# Patient Record
Sex: Female | Born: 2000 | Race: Black or African American | Hispanic: No | Marital: Single | Smoking: Never smoker
Health system: Southern US, Community
[De-identification: ages and names within clinical notes are randomized; demographics above are authoritative.]

## PROBLEM LIST (undated history)

## (undated) DIAGNOSIS — J45909 Unspecified asthma, uncomplicated: Secondary | ICD-10-CM

## (undated) DIAGNOSIS — Z789 Other specified health status: Secondary | ICD-10-CM

## (undated) DIAGNOSIS — R7303 Prediabetes: Secondary | ICD-10-CM

## (undated) HISTORY — PX: NO PAST SURGERIES: SHX2092

## (undated) HISTORY — DX: Other specified health status: Z78.9

---
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
## 2022-02-18 ENCOUNTER — Other Ambulatory Visit: Payer: Medicaid Other

## 2022-02-23 ENCOUNTER — Other Ambulatory Visit: Payer: Medicaid Other

## 2022-09-28 ENCOUNTER — Emergency Department (HOSPITAL_COMMUNITY)
Admission: EM | Admit: 2022-09-28 | Discharge: 2022-09-28 | Disposition: A | Discharge status: Home / Self Care | Payer: Self-pay | Attending: Emergency Medicine | Admitting: Emergency Medicine

## 2022-09-28 ENCOUNTER — Other Ambulatory Visit: Payer: Self-pay

## 2022-09-28 ENCOUNTER — Encounter (HOSPITAL_COMMUNITY): Payer: Self-pay | Admitting: Emergency Medicine

## 2022-09-28 DIAGNOSIS — J020 Streptococcal pharyngitis: Secondary | ICD-10-CM | POA: Insufficient documentation

## 2022-09-28 LAB — GROUP A STREP BY PCR: Group A Strep by PCR: DETECTED — AB

## 2022-09-28 MED ORDER — DEXAMETHASONE SODIUM PHOSPHATE 10 MG/ML IJ SOLN
10.0000 mg | Freq: Once | INTRAMUSCULAR | Status: AC
  Administered 2022-09-28: 10 mg via INTRAMUSCULAR
  Filled 2022-09-28: qty 1

## 2022-09-28 MED ORDER — AMOXICILLIN 500 MG PO CAPS: 500.0000 mg | ORAL_CAPSULE | Freq: Two times a day (BID) | ORAL | 0 refills | Status: AC

## 2022-09-28 MED ORDER — AMOXICILLIN 250 MG PO CAPS
500.0000 mg | ORAL_CAPSULE | Freq: Once | ORAL | Status: AC
  Administered 2022-09-28: 500 mg via ORAL
  Filled 2022-09-28: qty 2

## 2022-09-28 MED ORDER — IBUPROFEN 400 MG PO TABS: 400.0000 mg | ORAL_TABLET | Freq: Four times a day (QID) | ORAL | 0 refills | Status: DC | PRN

## 2022-09-28 NOTE — ED Triage Notes (Signed)
Pt c/o sore throat since Friday with white exudate. Pain rated 9/10 and voice sounds muffled. Denies recent sick contacts. Pt has felt warm but did not check temp at home.

## 2022-09-28 NOTE — ED Provider Notes (Signed)
[place] EMERGENCY DEPARTMENT AT [facility] Provider Note   CSN: [id number] Arrival date & time: 09/28/22  2113     History  Chief Complaint  Patient presents with   Sore Throat    Laura Moreno is a 22 y.o. female presenting with a 4-day history of sore throat, swollen tonsils and white patches on her tonsils along with subjective fever at home.  She has a history of tonsillitis in the past, to her knowledge she has never been diagnosed with strep throat however.  She difficulty sleeping last night secondary to snoring and pain.  She has had poor p.o. intake but has been able to tolerate fluids.  She has had no treatment for symptoms prior to arrival.  She denies nausea or vomiting, abdominal pain, cough or other URI symptoms.  The history is provided by the patient.       Home Medications Prior to Admission medications   Medication Sig Start Date End Date Taking? Authorizing Provider  amoxicillin (AMOXIL) 500 MG capsule Take 1 capsule (500 mg total) by mouth 2 (two) times daily for 10 days. 09/28/22 10/08/22 Yes Emonni Depasquale, Raynelle Fanning, PA-C  ibuprofen (ADVIL) 400 MG tablet Take 1 tablet (400 mg total) by mouth every 6 (six) hours as needed for moderate pain (and tonsillar swelling). 09/28/22  Yes Keelee Yankey, Raynelle Fanning, PA-C  fluconazole (DIFLUCAN) 150 MG tablet Take 1 now and 1 in 3 days 11/21/21   Adline Potter, NP      Allergies    Patient has no known allergies.    Review of Systems   Review of Systems  Constitutional:  Positive for chills and fever.  HENT:  Positive for sore throat and trouble swallowing. Negative for congestion, ear pain, rhinorrhea, sinus pressure and voice change.   Eyes:  Negative for discharge.  Respiratory:  Negative for cough, shortness of breath, wheezing and stridor.   Cardiovascular:  Negative for chest pain.  Gastrointestinal:  Negative for abdominal pain, nausea and vomiting.  Genitourinary: Negative.   All other systems reviewed and are  negative.   Physical Exam Updated Vital Signs BP 116/75 (BP Location: Right Arm)   Pulse (!) 109   Temp 99 F (37.2 C) (Oral)   Resp 15   Ht 5\' 6"  (1.676 m)   Wt 97.1 kg   LMP 09/16/2022 (Exact Date)   SpO2 100%   Breastfeeding No   BMI 34.54 kg/m  Physical Exam Vitals and nursing note reviewed.  Constitutional:      Appearance: She is well-developed.  HENT:     Head: Normocephalic and atraumatic.     Nose: Nose normal.     Mouth/Throat:     Palate: No mass and lesions.     Pharynx: Uvula midline.     Tonsils: Tonsillar exudate present. No tonsillar abscesses. 3+ on the right. 3+ on the left.     Comments: Bilateral tonsils symmetrically enlarged, erythematous with exudate.  No soft palate edema. Eyes:     Conjunctiva/sclera: Conjunctivae normal.  Cardiovascular:     Rate and Rhythm: Normal rate and regular rhythm.     Heart sounds: Normal heart sounds.  Pulmonary:     Effort: Pulmonary effort is normal.     Breath sounds: Normal breath sounds. No wheezing.  Abdominal:     General: Bowel sounds are normal.     Palpations: Abdomen is soft.     Tenderness: There is no abdominal tenderness.  Musculoskeletal:  General: Normal range of motion.     Cervical back: Normal range of motion.  Skin:    General: Skin is warm and dry.  Neurological:     Mental Status: She is alert.     ED Results / Procedures / Treatments   Labs (all labs ordered are listed, but only abnormal results are displayed) Labs Reviewed  GROUP A STREP BY PCR - Abnormal; Notable for the following components:      Result Value   Group A Strep by PCR DETECTED (*)    All other components within normal limits    EKG None  Radiology No results found.  Procedures Procedures    Medications Ordered in ED Medications  dexamethasone (DECADRON) injection 10 mg (has no administration in time range)  amoxicillin (AMOXIL) capsule 500 mg (500 mg Oral Given 09/28/22 2230)    ED Course/  Medical Decision Making/ A&P                             Medical Decision Making Patient presenting with day 4 of sore throat, swollen tonsils and exudate, subjective fever.  She is strep positive today.  There is no exam findings to suggest peritonsillar abscess.  Discussed treatment with penicillin, injection versus tablets, patient prefers tablets, first dose was given here.  She was also given an IM dose of Decadron to help with swelling.  Home instructions and strict return precautions were also discussed.  Amount and/or Complexity of Data Reviewed Labs: ordered.    Details: Strep positive  Risk Prescription drug management.           Final Clinical Impression(s) / ED Diagnoses Final diagnoses:  Strep throat    Rx / DC Orders ED Discharge Orders          Ordered    amoxicillin (AMOXIL) 500 MG capsule  2 times daily        09/28/22 2232    ibuprofen (ADVIL) 400 MG tablet  Every 6 hours PRN        09/28/22 2232              Burgess Amor, PA-C 09/28/22 2232    Terrilee Files, MD 09/29/22 [phone]

## 2022-09-28 NOTE — Discharge Instructions (Addendum)
Take your next dose of the antibiotic tomorrow morning.  I also recommend ibuprofen which has been prescribed for you.  This will help with pain and additional swelling although the steroid shot you received this evening should help greatly with the swelling over the next 24 hours.  Get rechecked immediately for any worsening swelling, worse pain, worse difficulty swallowing.

## 2023-03-29 ENCOUNTER — Encounter (HOSPITAL_COMMUNITY): Payer: Self-pay | Admitting: Radiology

## 2023-03-29 ENCOUNTER — Emergency Department (HOSPITAL_COMMUNITY): Payer: Self-pay

## 2023-03-29 ENCOUNTER — Other Ambulatory Visit: Payer: Self-pay

## 2023-03-29 ENCOUNTER — Emergency Department (HOSPITAL_COMMUNITY)
Admission: EM | Admit: 2023-03-29 | Discharge: 2023-03-29 | Disposition: A | Discharge status: Home / Self Care | Payer: Self-pay | Attending: Emergency Medicine | Admitting: Emergency Medicine

## 2023-03-29 DIAGNOSIS — O23599 Infection of other part of genital tract in pregnancy, unspecified trimester: Secondary | ICD-10-CM | POA: Insufficient documentation

## 2023-03-29 DIAGNOSIS — O3680X0 Pregnancy with inconclusive fetal viability, not applicable or unspecified: Secondary | ICD-10-CM

## 2023-03-29 DIAGNOSIS — Z3A00 Weeks of gestation of pregnancy not specified: Secondary | ICD-10-CM | POA: Insufficient documentation

## 2023-03-29 DIAGNOSIS — O26899 Other specified pregnancy related conditions, unspecified trimester: Secondary | ICD-10-CM | POA: Insufficient documentation

## 2023-03-29 DIAGNOSIS — A599 Trichomoniasis, unspecified: Secondary | ICD-10-CM

## 2023-03-29 DIAGNOSIS — O208 Other hemorrhage in early pregnancy: Secondary | ICD-10-CM | POA: Insufficient documentation

## 2023-03-29 LAB — WET PREP, GENITAL
Clue Cells Wet Prep HPF POC: NONE SEEN
Sperm: NONE SEEN
WBC, Wet Prep HPF POC: <10 (ref <10)
Yeast Wet Prep HPF POC: NONE SEEN

## 2023-03-29 LAB — URINALYSIS, ROUTINE W REFLEX MICROSCOPIC
Bilirubin Urine: NEGATIVE
Glucose, UA: NEGATIVE mg/dL
Hgb urine dipstick: NEGATIVE
Ketones, ur: NEGATIVE mg/dL
Nitrite: NEGATIVE
Protein, ur: 100 mg/dL — AB
Specific Gravity, Urine: 1.026 (ref 1.005–1.030)
Squamous Epithelial / HPF: >50 /[HPF] (ref 0–5)
pH: 7 (ref 5.0–8.0)

## 2023-03-29 LAB — CBC
HCT: 34.6 % — ABNORMAL LOW (ref 36.0–46.0)
Hemoglobin: 11.6 g/dL — ABNORMAL LOW (ref 12.0–15.0)
MCH: 29.6 pg (ref 26.0–34.0)
MCHC: 33.5 g/dL (ref 30.0–36.0)
MCV: 88.3 fL (ref 80.0–100.0)
Platelets: 299 10*3/uL (ref 150–400)
RBC: 3.92 MIL/uL (ref 3.87–5.11)
RDW: 13.1 % (ref 11.5–15.5)
WBC: 5.8 10*3/uL (ref 4.0–10.5)
nRBC: 0 % (ref 0.0–0.2)

## 2023-03-29 LAB — COMPREHENSIVE METABOLIC PANEL
ALT: 8 U/L (ref 0–44)
AST: 13 U/L — ABNORMAL LOW (ref 15–41)
Albumin: 3.5 g/dL (ref 3.5–5.0)
Alkaline Phosphatase: 54 U/L (ref 38–126)
Anion gap: 5 (ref 5–15)
BUN: 8 mg/dL (ref 6–20)
CO2: 26 mmol/L (ref 22–32)
Calcium: 8.2 mg/dL — ABNORMAL LOW (ref 8.9–10.3)
Chloride: 103 mmol/L (ref 98–111)
Creatinine, Ser: 0.66 mg/dL (ref 0.44–1.00)
GFR, Estimated: >60 mL/min (ref >60)
Glucose, Bld: 116 mg/dL — ABNORMAL HIGH (ref 70–99)
Potassium: 4.1 mmol/L (ref 3.5–5.1)
Sodium: 134 mmol/L — ABNORMAL LOW (ref 135–145)
Total Bilirubin: 0.9 mg/dL (ref <1.2)
Total Protein: 8 g/dL (ref 6.5–8.1)

## 2023-03-29 LAB — PREGNANCY, URINE: Preg Test, Ur: POSITIVE — AB

## 2023-03-29 LAB — SAMPLE TO BLOOD BANK

## 2023-03-29 LAB — HCG, QUANTITATIVE, PREGNANCY: hCG, Beta Chain, Quant, S: 283 m[IU]/mL — ABNORMAL HIGH (ref <5)

## 2023-03-29 MED ORDER — STERILE WATER FOR INJECTION IJ SOLN: 2.1000 mL | Freq: Once | INTRAMUSCULAR | Status: AC

## 2023-03-29 MED ORDER — CEPHALEXIN 500 MG PO CAPS: 500.0000 mg | ORAL_CAPSULE | Freq: Two times a day (BID) | ORAL | 0 refills | Status: AC

## 2023-03-29 MED ORDER — STERILE WATER FOR INJECTION IJ SOLN
INTRAMUSCULAR | Status: AC
  Administered 2023-03-29: 2.1 mL via INTRAMUSCULAR
  Filled 2023-03-29: qty 10

## 2023-03-29 MED ORDER — AZITHROMYCIN 250 MG PO TABS
1000.0000 mg | ORAL_TABLET | Freq: Once | ORAL | Status: AC
  Administered 2023-03-29: 1000 mg via ORAL
  Filled 2023-03-29: qty 4

## 2023-03-29 MED ORDER — METRONIDAZOLE 500 MG PO TABS: 500.0000 mg | ORAL_TABLET | Freq: Two times a day (BID) | ORAL | 0 refills | Status: DC

## 2023-03-29 MED ORDER — CEFTRIAXONE SODIUM 500 MG IJ SOLR
500.0000 mg | Freq: Once | INTRAMUSCULAR | Status: AC
  Administered 2023-03-29: 500 mg via INTRAMUSCULAR
  Filled 2023-03-29: qty 500

## 2023-03-29 MED ORDER — PRENATAL VITAMINS 28-0.8 MG PO TABS: 1.0000 | ORAL_TABLET | Freq: Every day | ORAL | 3 refills | Status: DC

## 2023-03-29 MED ORDER — METRONIDAZOLE 500 MG PO TABS
500.0000 mg | ORAL_TABLET | Freq: Once | ORAL | Status: AC
  Administered 2023-03-29: 500 mg via ORAL
  Filled 2023-03-29: qty 1

## 2023-03-29 NOTE — ED Notes (Signed)
Pt in [place]

## 2023-03-29 NOTE — ED Provider Notes (Signed)
[place] EMERGENCY DEPARTMENT AT [facility] Provider Note   CSN: [id number] Arrival date & time: 03/29/23  1751     History  Chief Complaint  Patient presents with   Abdominal Pain    Laura Moreno is a 22 y.o. female.  He has PMH of metabolic syndrome and prediabetes, presents the ER complaining of lower abdominal pain for the past 4 to 5 days with vaginal spotting the day it started.  Had positive home pregnancy test.  She is G2, P1 with no complications in her first pregnancy several years ago.  Denies chest pain or shortness of breath, no vomiting, complains of mild headaches.  Pain is in the suprapubic and right pelvic area primarily.  No urinary complaints, no abnormal vaginal discharge.   Abdominal Pain      Home Medications Prior to Admission medications   Medication Sig Start Date End Date Taking? Authorizing Provider  cephALEXin (KEFLEX) 500 MG capsule Take 1 capsule (500 mg total) by mouth 2 (two) times daily for 5 days. 03/29/23 04/03/23 Yes Deontez Klinke A, PA-C  metroNIDAZOLE (FLAGYL) 500 MG tablet Take 1 tablet (500 mg total) by mouth 2 (two) times daily. 03/29/23  Yes Gal Smolinski A, PA-C  fluconazole (DIFLUCAN) 150 MG tablet Take 1 now and 1 in 3 days 11/21/21   Cyril Mourning A, NP  ibuprofen (ADVIL) 400 MG tablet Take 1 tablet (400 mg total) by mouth every 6 (six) hours as needed for moderate pain (and tonsillar swelling). 09/28/22   Burgess Amor, PA-C      Allergies    Patient has no known allergies.    Review of Systems   Review of Systems  Gastrointestinal:  Positive for abdominal pain.    Physical Exam Updated Vital Signs BP 120/70 (BP Location: Right Arm)   Pulse 94   Temp 98.8 F (37.1 C) (Oral)   Resp 18   Ht 5\' 6"  (1.676 m)   Wt 129.7 kg   SpO2 97%   BMI 46.16 kg/m  Physical Exam Vitals and nursing note reviewed.  Constitutional:      General: She is not in acute distress.    Appearance: She is well-developed.   HENT:     Head: Normocephalic and atraumatic.  Eyes:     Conjunctiva/sclera: Conjunctivae normal.  Cardiovascular:     Rate and Rhythm: Normal rate and regular rhythm.     Heart sounds: No murmur heard. Pulmonary:     Effort: Pulmonary effort is normal. No respiratory distress.     Breath sounds: Normal breath sounds.  Abdominal:     Palpations: Abdomen is soft.     Tenderness: There is abdominal tenderness in the suprapubic area.  Musculoskeletal:        General: No swelling.     Cervical back: Neck supple.  Skin:    General: Skin is warm and dry.     Capillary Refill: Capillary refill takes less than 2 seconds.  Neurological:     Mental Status: She is alert.  Psychiatric:        Mood and Affect: Mood normal.     ED Results / Procedures / Treatments   Labs (all labs ordered are listed, but only abnormal results are displayed) Labs Reviewed  WET PREP, GENITAL - Abnormal; Notable for the following components:      Result Value   Trich, Wet Prep PRESENT (*)    All other components within normal limits  COMPREHENSIVE METABOLIC PANEL - Abnormal;  Notable for the following components:   Sodium 134 (*)    Glucose, Bld 116 (*)    Calcium 8.2 (*)    AST 13 (*)    All other components within normal limits  CBC - Abnormal; Notable for the following components:   Hemoglobin 11.6 (*)    HCT 34.6 (*)    All other components within normal limits  URINALYSIS, ROUTINE W REFLEX MICROSCOPIC - Abnormal; Notable for the following components:   APPearance CLOUDY (*)    Protein, ur 100 (*)    Leukocytes,Ua LARGE (*)    Bacteria, UA FEW (*)    All other components within normal limits  PREGNANCY, URINE - Abnormal; Notable for the following components:   Preg Test, Ur POSITIVE (*)    All other components within normal limits  HCG, QUANTITATIVE, PREGNANCY - Abnormal; Notable for the following components:   hCG, Beta Chain, Quant, S 283 (*)    All other components within normal limits   URINE CULTURE  SAMPLE TO BLOOD BANK  GC/CHLAMYDIA PROBE AMP ([place]) NOT AT [facility]    EKG None  Radiology US OB LESS THAN 14 WEEKS WITH OB TRANSVAGINAL  Result Date: 03/29/2023 CLINICAL DATA:  Initial evaluation for early pregnancy, vaginal bleeding. EXAM: OBSTETRIC <14 WK [place] AND TRANSVAGINAL OB US TECHNIQUE: Both transabdominal and transvaginal ultrasound examinations were performed for complete evaluation of the gestation as well as the maternal uterus, adnexal regions, and pelvic cul-de-sac. Transvaginal technique was performed to assess early pregnancy. COMPARISON:  None Available. FINDINGS: Intrauterine gestational sac: Negative Yolk sac:  Negative Embryo:  Negative Cardiac Activity: Negative Heart Rate: N/A Subchorionic hemorrhage:  None visualized. Maternal uterus/adnexae: Neither ovary is visualized. No adnexal mass or free fluid. IMPRESSION: 1. Early pregnancy with no discrete IUP or adnexal mass identified. Finding is consistent with a pregnancy of unknown anatomic location. Differential considerations include IUP to early to visualize, recent SAB, or possibly occult ectopic pregnancy. Close clinical monitoring with serial beta HCGs and close interval follow-up ultrasound recommended as clinically warranted. 2. No other acute maternal uterine or adnexal abnormality. Electronically Signed   By: Rise Mu M.D.   On: 03/29/2023 19:22    Procedures Procedures    Medications Ordered in ED Medications  metroNIDAZOLE (FLAGYL) tablet 500 mg (500 mg Oral Given 03/29/23 1949)    ED Course/ Medical Decision Making/ A&P Clinical Course as of 03/29/23 2010  Mon Mar 29, 2023  [id number] 22 year old female who is G2, P1 approximately [redacted] weeks pregnant based on LMP.  Presents for gradually worsening lower abdominal pain for the past 4 to 5 days, had some light vaginal spotting when this started.  Has had a positive home pregnancy test.  Now noted to be having pelvic pain just to the  right of midline.  She is tachycardic but abdomen is soft with only mild to moderate tenderness in the left suprapubic area and right lower pelvic area.  No current vaginal bleeding.  She has noted to be A+ blood type.  Labs ordered and ultrasound ordered for rule out ectopic. [CB]    Clinical Course User Index [CB] Ma Rings, PA-C                                 Medical Decision Making Ddx: ectopic pregnancy, early iup, ovarian torsion, ovarian cyst, appendicitis, other ED course: Briefly as above patient's about [redacted] weeks pregnant had some  lower abdominal pain for the past 4 to 5 days with small amount of vaginal spotting.  When she arrived she was mildly tachycardic but this resolved with no intervention.  I did do a FAST exam on initial evaluation which was negative, I do not see an IUP in the uterus.  Ultrasound ordered to rule out ectopic pregnancy.  No IUP on ultrasound, beta-hCG is 283, could be early IUP versus ectopic pregnancy that was not visualized.  She sitting in bed, comfortable, vital signs are normal.  She has not required any pain medication in the ED.  I consulted with Dr. Despina Hidden who recommends Follow up Wednesday in clinic for repeat HCG  Patient also has positive trichomoniasis will treat with Flagyl, GC chlamydia in process, she has mild anemia hemoglobin 11.6, no recent for comparison though she has been anemic in the past.  Urine shows large leuks with 11-20 red blood cells 11-20 white blood cells few bacteria and greater than 50 squamous epithelial cells, sent for culture but will treat patient with Keflex due to her being pregnant.  Amount and/or Complexity of Data Reviewed External Data Reviewed: labs and notes.    Details: Blood typing shows blood type a positive Labs: ordered. Decision-making details documented in ED Course. Radiology: ordered. Decision-making details documented in ED Course.  Risk OTC drugs. Prescription drug  management.           Final Clinical Impression(s) / ED Diagnoses Final diagnoses:  Pregnancy of unknown anatomic location  Trichomoniasis    Rx / DC Orders ED Discharge Orders          Ordered    metroNIDAZOLE (FLAGYL) 500 MG tablet  2 times daily        03/29/23 1959    cephALEXin (KEFLEX) 500 MG capsule  2 times daily        03/29/23 1959              Josem Kaufmann 03/29/23 2117    Lonell Grandchild, MD 03/30/23 1534

## 2023-03-29 NOTE — ED Notes (Signed)
Pt self swabbed for wet prep and gc/chlamydia

## 2023-03-29 NOTE — Discharge Instructions (Addendum)
You were seen in the emergency room today for pregnancy with lower abdominal pain.  You are pregnant but your ultrasound did not show location of the pregnancy, possibly because it is too early along.  The other possibility is that you have an ectopic pregnancy.  It is very important for you to follow-up Wednesday for repeat blood work to check your hCG level to make sure it is rising appropriately.  We also tested for sexually transmitted infections.  You are positive for trichomoniasis.  We are treating you with an antibiotic called Flagyl for this.  This is contagious.  You to tell any partners that they need to be tested and treated.  Your testing for gonorrhea and chlamydia are pending but we have treated you for both at your request.  Do not have sex for 7 days.  Important to use condoms to prevent infection.  Make sure your partner has also been treated completely before resuming having sex with him.  Your urine also had some bacteria in this.  We have started you on antibiotic for this.  Your urine was sent for culture.  Your OB/GYN doctor can follow-up on the culture results to see if you need to continue the antibiotics.

## 2023-03-29 NOTE — ED Triage Notes (Signed)
Pt states she has had severe abd pain since last week and had a positive pregnancy test last week. Pt states her last period began on October 19. Pt states she had one day of very light bleeding last Thursday.

## 2023-03-30 LAB — GC/CHLAMYDIA PROBE AMP (~~LOC~~) NOT AT ARMC
Chlamydia: NEGATIVE
Comment: NEGATIVE
Comment: NORMAL
Neisseria Gonorrhea: NEGATIVE

## 2023-03-30 LAB — URINE CULTURE

## 2023-03-31 ENCOUNTER — Other Ambulatory Visit: Payer: Self-pay

## 2023-04-07 ENCOUNTER — Other Ambulatory Visit: Payer: Self-pay

## 2023-04-10 ENCOUNTER — Emergency Department (HOSPITAL_COMMUNITY): Payer: Self-pay

## 2023-04-10 ENCOUNTER — Encounter (HOSPITAL_COMMUNITY): Payer: Self-pay | Admitting: *Deleted

## 2023-04-10 ENCOUNTER — Other Ambulatory Visit: Payer: Self-pay

## 2023-04-10 ENCOUNTER — Emergency Department (HOSPITAL_COMMUNITY)
Admission: EM | Admit: 2023-04-10 | Discharge: 2023-04-10 | Disposition: A | Discharge status: Home / Self Care | Payer: Self-pay | Attending: Emergency Medicine | Admitting: Emergency Medicine

## 2023-04-10 DIAGNOSIS — O200 Threatened abortion: Secondary | ICD-10-CM | POA: Insufficient documentation

## 2023-04-10 DIAGNOSIS — N939 Abnormal uterine and vaginal bleeding, unspecified: Secondary | ICD-10-CM | POA: Insufficient documentation

## 2023-04-10 DIAGNOSIS — R1030 Lower abdominal pain, unspecified: Secondary | ICD-10-CM | POA: Insufficient documentation

## 2023-04-10 LAB — BASIC METABOLIC PANEL
Anion gap: 6 (ref 5–15)
BUN: 11 mg/dL (ref 6–20)
CO2: 26 mmol/L (ref 22–32)
Calcium: 8.3 mg/dL — ABNORMAL LOW (ref 8.9–10.3)
Chloride: 102 mmol/L (ref 98–111)
Creatinine, Ser: 0.7 mg/dL (ref 0.44–1.00)
GFR, Estimated: >60 mL/min (ref >60)
Glucose, Bld: 123 mg/dL — ABNORMAL HIGH (ref 70–99)
Potassium: 3.7 mmol/L (ref 3.5–5.1)
Sodium: 134 mmol/L — ABNORMAL LOW (ref 135–145)

## 2023-04-10 LAB — CBC WITH DIFFERENTIAL/PLATELET
Abs Immature Granulocytes: 0.02 10*3/uL (ref 0.00–0.07)
Basophils Absolute: 0 10*3/uL (ref 0.0–0.1)
Basophils Relative: 1 %
Eosinophils Absolute: 0.1 10*3/uL (ref 0.0–0.5)
Eosinophils Relative: 1 %
HCT: 35.7 % — ABNORMAL LOW (ref 36.0–46.0)
Hemoglobin: 11.9 g/dL — ABNORMAL LOW (ref 12.0–15.0)
Immature Granulocytes: 0 %
Lymphocytes Relative: 33 %
Lymphs Abs: 1.8 10*3/uL (ref 0.7–4.0)
MCH: 28.9 pg (ref 26.0–34.0)
MCHC: 33.3 g/dL (ref 30.0–36.0)
MCV: 86.7 fL (ref 80.0–100.0)
Monocytes Absolute: 0.6 10*3/uL (ref 0.1–1.0)
Monocytes Relative: 11 %
Neutro Abs: 3.1 10*3/uL (ref 1.7–7.7)
Neutrophils Relative %: 54 %
Platelets: 313 10*3/uL (ref 150–400)
RBC: 4.12 MIL/uL (ref 3.87–5.11)
RDW: 12.7 % (ref 11.5–15.5)
WBC: 5.6 10*3/uL (ref 4.0–10.5)
nRBC: 0 % (ref 0.0–0.2)

## 2023-04-10 LAB — HCG, QUANTITATIVE, PREGNANCY: hCG, Beta Chain, Quant, S: 105 m[IU]/mL — ABNORMAL HIGH (ref <5)

## 2023-04-10 LAB — ABO/RH: ABO/RH(D): A POS

## 2023-04-10 MED ORDER — SODIUM CHLORIDE 0.9 % IV BOLUS
1000.0000 mL | Freq: Once | INTRAVENOUS | Status: AC
  Administered 2023-04-10: 1000 mL via INTRAVENOUS

## 2023-04-10 NOTE — ED Provider Notes (Signed)
[place] EMERGENCY DEPARTMENT AT [facility] Provider Note   CSN: [id number] Arrival date & time: 04/10/23  1431     History  Chief Complaint  Patient presents with   Vaginal Bleeding    Laura Moreno is a 22 y.o. female.  Patient to ED with vaginal bleeding that started earlier today. Positive pregnancy test on ED visit with a quant HCG of 283. She has not started prenatal care yet.   The history is provided by the patient. No language interpreter was used.  Vaginal Bleeding      Home Medications Prior to Admission medications   Medication Sig Start Date End Date Taking? Authorizing Provider  fluconazole (DIFLUCAN) 150 MG tablet Take 1 now and 1 in 3 days 11/21/21   Cyril Mourning A, NP  ibuprofen (ADVIL) 400 MG tablet Take 1 tablet (400 mg total) by mouth every 6 (six) hours as needed for moderate pain (and tonsillar swelling). 09/28/22   Burgess Amor, PA-C  metroNIDAZOLE (FLAGYL) 500 MG tablet Take 1 tablet (500 mg total) by mouth 2 (two) times daily. 03/29/23   Carmel Sacramento A, PA-C  Prenatal Vit-Fe Fumarate-FA (PRENATAL VITAMINS) 28-0.8 MG TABS Take 1 tablet by mouth daily at 12 noon. 03/29/23   Carmel Sacramento A, PA-C      Allergies    Patient has no known allergies.    Review of Systems   Review of Systems  Genitourinary:  Positive for vaginal bleeding.    Physical Exam Updated Vital Signs BP 131/77   Pulse (!) 110   Temp 98.5 F (36.9 C) (Oral)   Resp 18   Ht 5\' 6"  (1.676 m)   Wt 122.5 kg   LMP 02/20/2023   SpO2 100%   BMI 43.58 kg/m  Physical Exam Vitals and nursing note reviewed.  Constitutional:      Appearance: Normal appearance.  Pulmonary:     Effort: Pulmonary effort is normal.  Abdominal:     Palpations: Abdomen is soft.     Tenderness: There is abdominal tenderness in the suprapubic area.  Musculoskeletal:        General: Normal range of motion.  Skin:    General: Skin is warm and dry.     Coloration: Skin is not pale.   Neurological:     Mental Status: She is alert and oriented to person, place, and time.     ED Results / Procedures / Treatments   Labs (all labs ordered are listed, but only abnormal results are displayed) Labs Reviewed  HCG, QUANTITATIVE, PREGNANCY - Abnormal; Notable for the following components:      Result Value   hCG, Beta Chain, Quant, S 105 (*)    All other components within normal limits  CBC WITH DIFFERENTIAL/PLATELET - Abnormal; Notable for the following components:   Hemoglobin 11.9 (*)    HCT 35.7 (*)    All other components within normal limits  BASIC METABOLIC PANEL - Abnormal; Notable for the following components:   Sodium 134 (*)    Glucose, Bld 123 (*)    Calcium 8.3 (*)    All other components within normal limits  ABO/RH   Results for orders placed or performed during the hospital encounter of 04/10/23  hCG, quantitative, pregnancy  Result Value Ref Range   hCG, Beta Chain, Quant, S 105 (H) <5 mIU/mL  CBC with Differential  Result Value Ref Range   WBC 5.6 4.0 - 10.5 K/uL   RBC 4.12 3.87 - 5.11  MIL/uL   Hemoglobin 11.9 (L) 12.0 - 15.0 g/dL   HCT 28.4 (L) 13.2 - 44.0 %   MCV 86.7 80.0 - 100.0 fL   MCH 28.9 26.0 - 34.0 pg   MCHC 33.3 30.0 - 36.0 g/dL   RDW 10.2 72.5 - 36.6 %   Platelets 313 150 - 400 K/uL   nRBC 0.0 0.0 - 0.2 %   Neutrophils Relative % 54 %   Neutro Abs 3.1 1.7 - 7.7 K/uL   Lymphocytes Relative 33 %   Lymphs Abs 1.8 0.7 - 4.0 K/uL   Monocytes Relative 11 %   Monocytes Absolute 0.6 0.1 - 1.0 K/uL   Eosinophils Relative 1 %   Eosinophils Absolute 0.1 0.0 - 0.5 K/uL   Basophils Relative 1 %   Basophils Absolute 0.0 0.0 - 0.1 K/uL   Immature Granulocytes 0 %   Abs Immature Granulocytes 0.02 0.00 - 0.07 K/uL  Basic metabolic panel  Result Value Ref Range   Sodium 134 (L) 135 - 145 mmol/L   Potassium 3.7 3.5 - 5.1 mmol/L   Chloride 102 98 - 111 mmol/L   CO2 26 22 - 32 mmol/L   Glucose, Bld 123 (H) 70 - 99 mg/dL   BUN 11 6 - 20  mg/dL   Creatinine, Ser 4.40 0.44 - 1.00 mg/dL   Calcium 8.3 (L) 8.9 - 10.3 mg/dL   GFR, Estimated >34 >74 mL/min   Anion gap 6 5 - 15  ABO/Rh  Result Value Ref Range   ABO/RH(D)      A POS Performed at [facility], [address], [place], [place] [address]      EKG None  Radiology US OB LESS THAN 14 WEEKS WITH OB TRANSVAGINAL  Result Date: 04/10/2023 CLINICAL DATA:  Vaginal bleeding affecting in early pregnancy. EXAM: OBSTETRIC <14 WK [place] AND TRANSVAGINAL OB US TECHNIQUE: Both transabdominal and transvaginal ultrasound examinations were performed for complete evaluation of the gestation as well as the maternal uterus, adnexal regions, and pelvic cul-de-sac. Transvaginal technique was performed to assess early pregnancy. COMPARISON:  03/29/2023 FINDINGS: Intrauterine gestational sac: None Yolk sac:  Not Visualized. Embryo:  Not Visualized. Cardiac Activity: Not Visualized. Heart Rate: NA  bpm Maternal uterus/adnexae: Right ovary: Not visualized Left ovary: Appears normal Other :Fluid noted within the cervical canal. Free fluid:  Trace free fluid within the pelvis. IMPRESSION: 1. No intrauterine gestational sac, yolk sac, or fetal pole identified. Differential considerations include intrauterine pregnancy too early to be sonographically visualized, missed abortion, or ectopic pregnancy. Followup ultrasound is recommended in 10-14 days for further evaluation. Electronically Signed   By: Signa Kell M.D.   On: 04/10/2023 17:56    Procedures Procedures    Medications Ordered in ED Medications  sodium chloride 0.9 % bolus 1,000 mL (1,000 mLs Intravenous New Bag/Given 04/10/23 1624)    ED Course/ Medical Decision Making/ A&P                                 Medical Decision Making This patient presents to the ED for concern of pregnant vaginal bleeding, this involves an extensive number of treatment options, and is a complaint that carries with it a high risk of complications and  morbidity.  The differential diagnosis includes miscarriage, ectopic,    Co morbidities that complicate the patient evaluation  Metabolic syndrome, prediabetes   Additional history obtained:  Additional history and/or information obtained from  chart review, notable for Last ED visit for lower abdominal pain and spotting, found to be pregnant at that time.    Lab Tests:  I Ordered, and personally interpreted labs.  The pertinent results include:  Quant decreased from 283 previously to 105 today.  Hemoglobin stable.    Imaging Studies ordered:  I ordered imaging studies including OB ultrasound I independently visualized and interpreted imaging which showed No gestational sac visualized I agree with the radiologist interpretation - ddx includes early IUP, ectopic, miscarriage.   Test Considered:  Repeat US in 10-14 days as recommended by radiology   Problem List / ED Course:  Patient known to be pregnant now with heavy vaginal bleeding.  G2P1, uncomplicated first pregnancy.  [place] ordered - no IUP visulized.    Social Determinants of Health:  Has reliable transportation.   Disposition:  After consideration of the diagnostic results and the patients response to treatment, I feel that the patient would benefit from discharge home with follow up at [facility] for repeat US in 10 days, and repeat quant.    Amount and/or Complexity of Data Reviewed Labs: ordered. Radiology: ordered.           Final Clinical Impression(s) / ED Diagnoses Final diagnoses:  Threatened miscarriage    Rx / DC Orders ED Discharge Orders     None         Elpidio Anis, PA-C 04/10/23 1825    Vanetta Mulders, MD 04/11/23 [phone]

## 2023-04-10 NOTE — Discharge Instructions (Signed)
You will need to follow up with OB/GYN for repeat ultrasound as we discussed. Call Dr. Despina Hidden to schedule a time for that study.   If your bleeding worsens, or you develop severe pain or a fever, return to the ED for further evaluation.

## 2023-04-10 NOTE — ED Triage Notes (Addendum)
Pt with heavy vaginal bleeding an hour ago, gone through two pads with small clots.  Pt states she is about [redacted] weeks pregnant, has not seen OB yet.  + home pregnancy test. LMP 02/20/23

## 2023-12-10 ENCOUNTER — Ambulatory Visit: Payer: Self-pay

## 2023-12-13 ENCOUNTER — Ambulatory Visit: Payer: Self-pay

## 2023-12-14 ENCOUNTER — Emergency Department (HOSPITAL_COMMUNITY)
Admission: EM | Admit: 2023-12-14 | Discharge: 2023-12-14 | Disposition: A | Discharge status: Home / Self Care | Payer: Self-pay | Attending: Emergency Medicine | Admitting: Emergency Medicine

## 2023-12-14 ENCOUNTER — Encounter (HOSPITAL_COMMUNITY): Payer: Self-pay

## 2023-12-14 DIAGNOSIS — A5901 Trichomonal vulvovaginitis: Secondary | ICD-10-CM

## 2023-12-14 DIAGNOSIS — Z202 Contact with and (suspected) exposure to infections with a predominantly sexual mode of transmission: Secondary | ICD-10-CM | POA: Insufficient documentation

## 2023-12-14 LAB — PREGNANCY, URINE: Preg Test, Ur: NEGATIVE

## 2023-12-14 LAB — URINALYSIS, ROUTINE W REFLEX MICROSCOPIC
Bilirubin Urine: NEGATIVE
Glucose, UA: NEGATIVE mg/dL
Hgb urine dipstick: NEGATIVE
Ketones, ur: NEGATIVE mg/dL
Nitrite: NEGATIVE
Protein, ur: 30 mg/dL — AB
Specific Gravity, Urine: 1.025 (ref 1.005–1.030)
Squamous Epithelial / HPF: >50 /HPF (ref 0–5)
pH: 5 (ref 5.0–8.0)

## 2023-12-14 LAB — HIV ANTIBODY (ROUTINE TESTING W REFLEX): HIV Screen 4th Generation wRfx: NONREACTIVE

## 2023-12-14 MED ORDER — LIDOCAINE HCL (PF) 1 % IJ SOLN
2.1000 mL | Freq: Once | INTRAMUSCULAR | Status: AC
  Administered 2023-12-14 (×2): 2.1 mL
  Filled 2023-12-14: qty 5

## 2023-12-14 MED ORDER — METRONIDAZOLE 500 MG PO TABS: 500.0000 mg | ORAL_TABLET | Freq: Two times a day (BID) | ORAL | 0 refills | Status: AC

## 2023-12-14 MED ORDER — CEFTRIAXONE SODIUM 500 MG IJ SOLR
500.0000 mg | Freq: Once | INTRAMUSCULAR | Status: AC
  Administered 2023-12-14 (×2): 500 mg via INTRAMUSCULAR
  Filled 2023-12-14: qty 500

## 2023-12-14 MED ORDER — DOXYCYCLINE HYCLATE 100 MG PO CAPS: 100.0000 mg | ORAL_CAPSULE | Freq: Two times a day (BID) | ORAL | 0 refills | Status: AC

## 2023-12-14 NOTE — ED Provider Notes (Signed)
 [place] EMERGENCY DEPARTMENT AT [facility] Provider Note   CSN: [id number] Arrival date & time: 12/14/23  1755     Patient presents with: No chief complaint on file.   Laura Moreno is a 23 y.o. female.   HPI  Patient states that she was told by a female partner who she was sexually intimate with that he tested positive for something.  She thinks it was either gonorrhea or chlamydia.  She denies any itching, vaginal discharge, vaginal pain, abdominal pain nausea vomiting fevers or any other symptoms.      Prior to Admission medications   Medication Sig Start Date End Date Taking? Authorizing Provider  doxycycline  (VIBRAMYCIN ) 100 MG capsule Take 1 capsule (100 mg total) by mouth 2 (two) times daily for 7 days. 12/14/23 12/21/23 Yes Roe Koffman S, PA  metroNIDAZOLE  (FLAGYL ) 500 MG tablet Take 1 tablet (500 mg total) by mouth 2 (two) times daily. 12/14/23  Yes Neldon Hamp RAMAN, PA  fluconazole  (DIFLUCAN ) 150 MG tablet Take 1 now and 1 in 3 days 11/21/21   Signa Delon LABOR, NP    Allergies: Patient has no known allergies.    Review of Systems  Updated Vital Signs BP 115/73   Pulse 97   Temp 98.9 F (37.2 C) (Oral)   Resp 18   Ht 5' 7 (1.702 m)   Wt 122.5 kg   LMP 11/16/2023 (Exact Date)   SpO2 96%   Breastfeeding Unknown   BMI 42.30 kg/m   Physical Exam Vitals and nursing note reviewed.  Constitutional:      General: She is not in acute distress.    Appearance: Normal appearance. She is not ill-appearing.  HENT:     Head: Normocephalic and atraumatic.  Eyes:     General: No scleral icterus.       Right eye: No discharge.        Left eye: No discharge.     Conjunctiva/sclera: Conjunctivae normal.  Pulmonary:     Effort: Pulmonary effort is normal.     Breath sounds: No stridor.  Abdominal:     Tenderness: There is no abdominal tenderness. There is no guarding.  Neurological:     Mental Status: She is alert and oriented to person, place, and  time. Mental status is at baseline.     (all labs ordered are listed, but only abnormal results are displayed) Labs Reviewed  URINALYSIS, ROUTINE W REFLEX MICROSCOPIC - Abnormal; Notable for the following components:      Result Value   APPearance CLOUDY (*)    Protein, ur 30 (*)    Leukocytes,Ua LARGE (*)    Bacteria, UA RARE (*)    Trichomonas, UA PRESENT (*)    All other components within normal limits  PREGNANCY, URINE  HIV ANTIBODY (ROUTINE TESTING W REFLEX)  RPR  GC/CHLAMYDIA PROBE AMP () NOT AT [facility]    EKG: None  Radiology: No results found.   Procedures   Medications Ordered in the ED  cefTRIAXone  (ROCEPHIN ) injection 500 mg (500 mg Intramuscular Given 12/14/23 2122)  lidocaine  (PF) (XYLOCAINE ) 1 % injection 2.1 mL (2.1 mLs Other Given 12/14/23 2128)                                    Medical Decision Making Amount and/or Complexity of Data Reviewed Labs: ordered.  Risk Prescription drug management.   Patient states that  she was told by a female partner who she was sexually intimate with that he tested positive for something.  She thinks it was either gonorrhea or chlamydia.  She denies any itching, vaginal discharge, vaginal pain, abdominal pain nausea vomiting fevers or any other symptoms.  Abdominal exam benign  No symptoms You are pregnant negative, UA trichomonas Chlamydia HIV and RPR  Patient to follow-up with the health department.  Treated with 500 mg of Rocephin  IM and doxycycline  Flagyl  twice daily for 7 days at home.  Return precautions discussed   Final diagnoses:  STD exposure  Trichomonas vaginitis    ED Discharge Orders          Ordered    doxycycline  (VIBRAMYCIN ) 100 MG capsule  2 times daily        12/14/23 2024    metroNIDAZOLE  (FLAGYL ) 500 MG tablet  2 times daily        12/14/23 2028               Neldon Inoue [place], [place] 12/15/23 0001    Cleotilde Rogue, MD 12/16/23 1315

## 2023-12-14 NOTE — ED Triage Notes (Addendum)
 Pt comes in for STI check. Pt found her BF had cheated on her and he believes he got something. A&Ox4. Pt has no complaints or s/s

## 2023-12-14 NOTE — Discharge Instructions (Signed)
 You have been treated in the emergency department for a sexually transmitted disease. Results of your gonorrhea and chlamydia tests are pending and you will be notified if they are positive. Please refrain from intercourse for 7 days and until all sex partners (within previous 60 days) are evaluated and/or treated as well. Please follow up with your primary care provider for continued care and further STD evaluation.  It is very important to practice safe sex and use condoms when sexually active. If your results are positive you need to notify all sexual partners so they can be treated as well. The website [email or web address] can be used to send anonymous text messages or emails to alert sexual contacts. Follow up with your doctor, or OBGYN in regards to today's visit.   Gonorrhea and Chlamydia SYMPTOMS  In females, symptoms may go unnoticed. Symptoms that are more noticeable can include:  Belly (abdominal) pain.  Painful intercourse.  Watery mucous-like discharge from the vagina.  Miscarriage.  Discomfort when urinating.  Inflammation of the rectum.  Abnormal gray-green frothy vaginal discharge  Vaginal itching and irritatio  Itching and irritation of the area outside the vagina.   Painful urination.  Bleeding after sexual intercourse.  In males, symptoms include:  Burning with urination.  Pain in the testicles.  Watery mucous-like discharge from the penis.  It can cause longstanding (chronic) pelvic pain after frequent infections.  TREATMENT  PID can cause women to not be able to have children (sterile) if left untreated or if half-treated.  It is important to finish ALL medications given to you.  This is a sexually transmitted infection. So you are also at risk for other sexually transmitted diseases, including HIV (AIDS), it is recommended that you get tested. HOME CARE INSTRUCTIONS  Warning: This infection is contagious. Do not have sex until treatment is completed. Follow up  at your caregiver's office or the clinic to which you were referred. If your diagnosis (learning what is wrong) is confirmed by culture or some other method, your recent sexual contacts need treatment. Even if they are symptom free or have a negative culture or evaluation, they should be treated.  PREVENTION  Women should use sanitary pads instead of tampons for vaginal discharge.  Wipe front to back after using the toilet and avoid douching.   Practice safe sex, use condoms, have only one sex partner and be sure your sex partner is not having sex with others.  Ask your caregiver to test you for chlamydia at your regular checkups or sooner if you are having symptoms.  Ask for further information if you are pregnant.  SEEK IMMEDIATE MEDICAL CARE IF:  You develop an oral temperature above 102 F (38.9 C), not controlled by medications or lasting more than 2 days.  You develop an increase in pain.  You develop any type of abnormal discharge.  You develop vaginal bleeding and it is not time for your period.  You develop painful intercourse.   Bacterial Vaginosis  Bacterial vaginosis (BV) is a vaginal infection where the normal balance of bacteria in the vagina is disrupted. This is not a sexually transmitted disease and your sexual partners do NOT need to be treated. CAUSES  The cause of BV is not fully understood. BV develops when there is an increase or imbalance of harmful bacteria.  Some activities or behaviors can upset the normal balance of bacteria in the vagina and put women at increased risk including:  Having a new sex partner or multiple sex  partners.  Douching.  Using an intrauterine device (IUD) for contraception.  It is not clear what role sexual activity plays in the development of BV. However, women that have never had sexual intercourse are rarely infected with BV.  Women do not get BV from toilet seats, bedding, swimming pools or from touching objects around them.   SYMPTOMS   Grey vaginal discharge.  A fish-like odor with discharge, especially after sexual intercourse.  Itching or burning of the vagina and vulva.  Burning or pain with urination.  Some women have no signs or symptoms at all.   TREATMENT  Sometimes BV will clear up without treatment.  BV may be treated with antibiotics.  BV can recur after treatment. If this happens, a second round of antibiotics will often be prescribed.  HOME CARE INSTRUCTIONS  Finish all medication as directed by your caregiver.  Do not have sex until treatment is completed.  Do NOT drink any alcoholic beverages while being treated  with Metronidazole  (Flagyl ). This will cause a severe reaction inducing vomiting.  RESOURCE GUIDE  Dental Problems  Patients with Medicaid: [facility] [phone] [address]                                           [phone] [address] Phone:  [phone]                                                  Phone:  [phone]  If unable to pay or uninsured, contact:  [facility] or [facility]. to become qualified for the adult dental clinic.  Chronic Pain Problems Contact [facility]  [phone] Patients need to be referred by their primary care doctor.  Insufficient Money for Medicine Contact [employer]:  call 211 or [facility] 340-004-4528.  No Primary Care Doctor Call [facility]  4700085712 Other agencies that provide inexpensive medical care    [facility]  [phone]    [facility]  [phone]    [facility]  [phone]    [facility]  4143096287    [facility]  3168604083    [facility]  [phone]  Psychological Services [facility]  [phone] [facility]  [phone] [facility]   7198061346 (emergency services [phone])  Substance Abuse Resources [facility]  [phone] Addiction  [facility] [phone] [facility] [phone] [place] [phone] Residential & Outpatient Substance Abuse Program  6806579377  Abuse/Neglect [facility] Child Abuse Hotline [phone]) 470-8638 [facility] Child Abuse Hotline 325-294-5247 (After Hours)  Emergency Shelter [facility] [phone]  [facility] [place] [facility] [place] [phone] [facility] [phone]  MRSA Hotline #:   515-581-7938    [facility] Resources  [facility]     [employer]                          [facility] [address]  [place]                       [address]      [address] [address]  Tinnie Keenan Keenan Phone:  [phone]                                   Phone:  [phone]                 Phone:  [phone]  [facility] Phone:  [phone]  [facility] Child Abuse Hotline [phone] [phone] (After Hours)

## 2023-12-15 LAB — GC/CHLAMYDIA PROBE AMP (~~LOC~~) NOT AT ARMC
Chlamydia: NEGATIVE
Comment: NEGATIVE
Comment: NORMAL
Neisseria Gonorrhea: NEGATIVE

## 2023-12-15 LAB — RPR: RPR Ser Ql: NONREACTIVE

## 2024-02-21 ENCOUNTER — Ambulatory Visit: Payer: Self-pay

## 2024-03-25 ENCOUNTER — Ambulatory Visit: Payer: Self-pay
